# Patient Record
Sex: Male | Born: 1945 | Race: White | Hispanic: No | Marital: Married | Smoking: Former smoker
Health system: Southern US, Community
[De-identification: ages and names within clinical notes are randomized; demographics above are authoritative.]

## PROBLEM LIST (undated history)

## (undated) DIAGNOSIS — J302 Other seasonal allergic rhinitis: Secondary | ICD-10-CM

## (undated) DIAGNOSIS — N400 Enlarged prostate without lower urinary tract symptoms: Secondary | ICD-10-CM

## (undated) DIAGNOSIS — G4733 Obstructive sleep apnea (adult) (pediatric): Secondary | ICD-10-CM

## (undated) DIAGNOSIS — Z9989 Dependence on other enabling machines and devices: Secondary | ICD-10-CM

## (undated) HISTORY — PX: ANOMALOUS PULMONARY VENOUS RETURN REPAIR, TOTAL: SHX1156

## (undated) HISTORY — DX: Obstructive sleep apnea (adult) (pediatric): Z99.89

## (undated) HISTORY — PX: APPENDECTOMY: SHX54

## (undated) HISTORY — DX: Obstructive sleep apnea (adult) (pediatric): G47.33

## (undated) HISTORY — PX: HERNIA REPAIR: SHX51

## (undated) HISTORY — PX: ROTATOR CUFF REPAIR: SHX139

---

## 2007-10-17 ENCOUNTER — Ambulatory Visit: Payer: Self-pay | Admitting: Surgery

## 2007-10-24 ENCOUNTER — Ambulatory Visit: Payer: Self-pay | Admitting: Surgery

## 2009-05-13 ENCOUNTER — Ambulatory Visit: Payer: Self-pay

## 2009-06-04 ENCOUNTER — Ambulatory Visit: Payer: Self-pay | Admitting: Orthopedic Surgery

## 2009-06-11 ENCOUNTER — Ambulatory Visit: Payer: Self-pay | Admitting: Orthopedic Surgery

## 2009-06-20 ENCOUNTER — Emergency Department: Payer: Self-pay | Admitting: Internal Medicine

## 2012-08-30 ENCOUNTER — Ambulatory Visit: Payer: Self-pay | Admitting: Gastroenterology

## 2013-07-15 DIAGNOSIS — N138 Other obstructive and reflux uropathy: Secondary | ICD-10-CM | POA: Insufficient documentation

## 2013-07-15 DIAGNOSIS — N401 Enlarged prostate with lower urinary tract symptoms: Secondary | ICD-10-CM | POA: Insufficient documentation

## 2014-07-25 DIAGNOSIS — K219 Gastro-esophageal reflux disease without esophagitis: Secondary | ICD-10-CM | POA: Insufficient documentation

## 2014-07-25 DIAGNOSIS — IMO0001 Reserved for inherently not codable concepts without codable children: Secondary | ICD-10-CM | POA: Insufficient documentation

## 2014-07-25 DIAGNOSIS — E7800 Pure hypercholesterolemia, unspecified: Secondary | ICD-10-CM | POA: Insufficient documentation

## 2015-08-28 ENCOUNTER — Ambulatory Visit: Payer: Medicare Other | Admitting: Occupational Therapy

## 2015-09-01 ENCOUNTER — Ambulatory Visit: Payer: Medicare Other | Attending: Rheumatology | Admitting: Occupational Therapy

## 2015-09-01 DIAGNOSIS — M25641 Stiffness of right hand, not elsewhere classified: Secondary | ICD-10-CM | POA: Diagnosis present

## 2015-09-01 DIAGNOSIS — M79641 Pain in right hand: Secondary | ICD-10-CM | POA: Insufficient documentation

## 2015-09-01 NOTE — Therapy (Signed)
[facility] PHYSICAL AND SPORTS MEDICINE [address] [place], [place], [address] Phone: [phone]   Fax:  [phone]  Occupational Therapy Treatment  Patient Details  Name: Ethan Patton MRN: [id number] Date of Birth: Nov 09, 1945 Referring Provider: Gavin PottersKernodle  Encounter Date: 09/01/2015      OT End of Session - 09/01/15 1449    Visit Number 1   Number of Visits 4   Date for OT Re-Evaluation 09/29/15   OT Start Time 0835   OT Stop Time 0925   OT Time Calculation (min) 50 min   Activity Tolerance Patient tolerated treatment well   Behavior During Therapy [facility] for tasks assessed/performed      No past medical history on file.  No past surgical history on file.  There were no vitals filed for this visit.  Visit Diagnosis:  Pain of right hand - Plan: Ot plan of care cert/re-cert  Stiffness of hand joint, right - Plan: Ot plan of care cert/re-cert      Subjective Assessment - 09/01/15 0949    Subjective  [place] - and my hand - those 2 fingers started hurting aboutJan    Patient Stated Goals Want to be able to move those fingers better , less soreness and strength    Currently in Pain? Yes   Pain Score 2    Pain Location Finger (Comment which one)   Pain Orientation Right   Pain Descriptors / Indicators Sore   Pain Type Chronic pain   Pain Onset More than a month ago            [facility] Assessment - 09/01/15 0001    Assessment   Diagnosis R 2nd and 3rd MC digits pain    Referring Provider [employer]   Onset Date 11/07/14   Assessment Pt refer to hand therapy /OT for  R hand pain -by Dr Gavin PottersKernodle    Balance Screen   Has the patient fallen in the past 6 months No   Has the patient had a decrease in activity level because of a fear of falling?  No   Is the patient reluctant to leave their home because of a fear of falling?  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    Strength   Right Hand Grip (lbs) 82   Right Hand Lateral Pinch 21 lbs   Right Hand 3 Point Pinch 17 lbs   Left Hand Grip (lbs) 75   Left Hand Lateral Pinch 23 lbs   Left Hand 3 Point Pinch 16 lbs   Right Hand AROM   R Index  MCP 0-90 70 Degrees   R Long  MCP 0-90 75 Degrees   R Ring  MCP 0-90 85 Degrees   R Little  MCP 0-90 90 Degrees   Left Hand AROM   L Index  MCP 0-90 90 Degrees   L Long  MCP 0-90 90 Degrees   L Ring  MCP 0-90 90 Degrees   L Little  MCP 0-90 90 Degrees                  OT Treatments/Exercises (OP) - 09/01/15 0001    RUE Paraffin   Number Minutes Paraffin 10 Minutes   RUE  Paraffin Location Hand   Comments prior to review of HEP - ROM at [facility] increase 10 degrees at 2nd                 OT Education - 09/01/15 1448    Education provided Yes   Education Details HEP, joint protecition and AE   Person(s) Educated Patient   Methods Tactile cues;Demonstration;Explanation;Verbal cues;Handout   Comprehension Verbalized understanding;Returned demonstration;Verbal cues required          OT Short Term Goals - 09/01/15 1456    OT SHORT TERM GOAL #1   Title Pt AROM in all digits improved by 5-10 degrees to report more ease in using with twisting of knobs/bolts    Baseline  MC flexion on R 70-75 at 2nd and 3rd    Time 3   Period Weeks   Status New   OT SHORT TERM GOAL #2   Title Pt to be ind in HEP to increaes ROM and functional use    Baseline no knowledge of HEP    Time 2   Period Weeks   Status New           OT Long Term Goals - 09/01/15 1457    OT LONG TERM GOAL #1   Title Pt verbalize 3 joint protection and adaptations with tasks to increase ease and prevent future flare up    Baseline  no knowlege on joint protection    Time 4   Period Weeks   Status New               Plan - 09/01/15  1450    Clinical Impression Statement Pt present with increase pain over R dominant hand 2nd and 3rd MC - decrease ROM - pt grip and prehension WFL -but he do RD if loading 2nd and 3rd during 3 point grip - pt can benefit from OT services to decrease pain , increase ROM and  joint protection and AE education    Pt will benefit from skilled therapeutic intervention in order to improve on the following deficits (Retired) Decreased range of motion;Impaired flexibility;Pain;Decreased strength;Impaired UE functional use   Rehab Potential Good   OT Frequency 1x / week   OT Duration 4 weeks   OT Treatment/Interventions Self-care/ADL training;Patient/family education;Therapeutic exercise;Manual Therapy;Parrafin;DME and/or AE instruction   Plan assess ROM , pain - ed on joint protection and AE   OT Home Exercise Plan see pt instruction   Consulted and Agree with Plan of Care Patient        Problem List There are no active problems to display for this patient.   Oletta Cohn OTR/L,CLT 09/01/2015, 3:03 PM  [facility] [facility] [facility] PHYSICAL AND SPORTS MEDICINE [address] [place], [address] Phone: [phone]   Fax:  [phone]  Name: Ethan Patton MRN: [id number] Date of Birth: 07-18-1946

## 2015-09-01 NOTE — Patient Instructions (Signed)
Pt to do moist heat  Tapping of digits Tendon glides  Opposition  RD of digits  10 reps each  1 -2 x day   Ed on joint protection principles and AE trng - hand out provided

## 2015-09-08 ENCOUNTER — Ambulatory Visit: Payer: Medicare Other | Attending: Hematology and Oncology | Admitting: Occupational Therapy

## 2015-09-08 DIAGNOSIS — M25641 Stiffness of right hand, not elsewhere classified: Secondary | ICD-10-CM | POA: Insufficient documentation

## 2015-09-08 DIAGNOSIS — M79641 Pain in right hand: Secondary | ICD-10-CM | POA: Diagnosis present

## 2015-09-08 NOTE — Patient Instructions (Signed)
COnt with same HEP as last time - add tapping - extention of digits   Reinforce joint protection principles - larger joints, avoid tight sustained grip  AE to get

## 2015-09-08 NOTE — Therapy (Signed)
[facility] PHYSICAL AND SPORTS MEDICINE [address], [place], [address] Phone: [phone]   Fax:  [phone]  Occupational Therapy Treatment and discharge  Patient Details  Name: Ethan Patton MRN: [id number] Date of Birth: 1946/04/30 Referring Provider: Jefm Bryant  Encounter Date: 09/08/2015      OT End of Session - 09/08/15 1107    Visit Number 2   Number of Visits 2   Date for OT Re-Evaluation 09/08/15   OT Start Time 0953   OT Stop Time 1023   OT Time Calculation (min) 30 min   Activity Tolerance Patient tolerated treatment well   Behavior During Therapy [facility] for tasks assessed/performed      No past medical history on file.  No past surgical history on file.  There were no vitals filed for this visit.  Visit Diagnosis:  Pain of right hand  Stiffness of hand joint, right      Subjective Assessment - 09/08/15 0956    Subjective  Pain not that bad - more stiffness - and did the heatingpad - the side ways exercises hurt    Patient Stated Goals Want to be able to move those fingers better , less soreness and strength    Currently in Pain? Yes   Pain Score 2    Pain Location Hand   Pain Orientation Right   Pain Descriptors / Indicators Sore            [facility] OT Assessment - 09/08/15 0001    Strength   Right Hand Grip (lbs) 82   Right Hand Lateral Pinch 21 lbs   Right Hand 3 Point Pinch 17 lbs   Left Hand Grip (lbs) 75   Left Hand Lateral Pinch 23 lbs   Left Hand 3 Point Pinch 16 lbs   Right Hand AROM   R Index  MCP 0-90 80 Degrees   R Long  MCP 0-90 90 Degrees   R Ring  MCP 0-90 90 Degrees   R Little  MCP 0-90 90 Degrees   Left Hand AROM   L Index  MCP 0-90 90 Degrees   L Long  MCP 0-90 90 Degrees   L Ring  MCP 0-90 90 Degrees   L Little  MCP 0-90 90 Degrees                  OT Treatments/Exercises (OP) - 09/08/15 0001    ADLs   ADL Comments Discuss with pt joint protection - large joint   and built up handles - avoid tight prolonged grip - also jar openers , pt not holding book  - get clip , and pick up laptop with palm - not lat grip , did change some like pick up groceries with palm underneath    Hand Exercises   Other Hand Exercises Tendon glides  AROM , opposition , digits extention tapping  , and RD of digits - had no pain except if flexion of 2nd MC more than 85 - needed min v/c to pull down into palm - not fist only to distal palm    Other Hand Exercises measurement for ROM taken   RUE Paraffin   Number Minutes Paraffin 10 Minutes   RUE Paraffin Location Hand   Comments Prior to ROM , to increase ROM , decrease pain -  ROM increase with 10-15 degrees    Manual Therapy   Manual therapy comments Soft tissue mobs to 2nd and 3rd MC -  joint mobs and gentle traction - no pain  prior to ROM                 OT Education - 09/08/15 1107    Education provided Yes   Education Details HEP , jointprotection    Person(s) Educated Patient   Methods Explanation;Demonstration;Verbal cues;Tactile cues   Comprehension Verbal cues required;Returned demonstration;Verbalized understanding          OT Short Term Goals - 09/08/15 1110    OT SHORT TERM GOAL #1   Title Pt AROM in all digits improved by 5-10 degrees to report more ease in using with twisting of knobs/bolts    Baseline MC 80  2nd , 3rd MC 90 degrees   Status Achieved   OT SHORT TERM GOAL #2   Title Pt to be ind in HEP to increaes ROM and functional use    Baseline has knowledge   Status Achieved           OT Long Term Goals - 09/08/15 1110    OT LONG TERM GOAL #1   Title Pt verbalize 3 joint protection and adaptations with tasks to increase ease and prevent future flare up    Baseline implementing some of it - still needed some education and reinforcement    Status Achieved               Plan - 09/08/15 1108    Clinical Impression Statement Pt show understanding for HEP to increase and  maintain ROM in digits - as well as joint protection principles/AE to implement to prevent  pain and maintain his ROM / strength - pt met all goals and discharge from OT services at this time with HEP    Rehab Potential Good   OT Treatment/Interventions Self-care/ADL training;Patient/family education;Therapeutic exercise;Manual Therapy;Parrafin;DME and/or AE instruction   Plan Discharge with home program    OT Home Exercise Plan see pt instruction   Consulted and Agree with Plan of Care Patient        Problem List There are no active problems to display for this patient.   Rosalyn Gess OTR/L.,CLT  09/08/2015, 11:12 AM  [facility] PHYSICAL AND SPORTS MEDICINE [address], [place], [address] Phone: [phone]   Fax:  [phone]  Name: Ethan Patton MRN: [id number] Date of Birth: [birth year]

## 2017-08-09 ENCOUNTER — Ambulatory Visit
Admission: EM | Admit: 2017-08-09 | Discharge: 2017-08-09 | Disposition: A | Discharge status: Home / Self Care | Payer: Medicare Other | Attending: Family Medicine | Admitting: Family Medicine

## 2017-08-09 DIAGNOSIS — H60501 Unspecified acute noninfective otitis externa, right ear: Secondary | ICD-10-CM

## 2017-08-09 DIAGNOSIS — H6691 Otitis media, unspecified, right ear: Secondary | ICD-10-CM | POA: Diagnosis not present

## 2017-08-09 MED ORDER — OFLOXACIN 0.3 % OT SOLN: 10.0000 [drp] | Freq: Every day | OTIC | 0 refills | Status: AC

## 2017-08-09 MED ORDER — CEFDINIR 300 MG PO CAPS: 300.0000 mg | ORAL_CAPSULE | Freq: Two times a day (BID) | ORAL | 0 refills | Status: DC

## 2017-08-09 NOTE — ED Triage Notes (Signed)
Patient complains of right ear pressure. Patient states that he has been noticing it for 4 days. Patient states that he has placed hydrogen peroxide in the ear. Patient states that he felt like he had liquid in the ear and now has decreased hearing in this ear.

## 2017-08-09 NOTE — Discharge Instructions (Signed)
Take medication as prescribed. Rest. Drink plenty of fluids.  ° °Follow up with your primary care physician this week as needed. Return to Urgent care for new or worsening concerns.  ° °

## 2017-08-09 NOTE — ED Provider Notes (Signed)
[facility] ____________________________________________  Time seen: Approximately 0950 AM  I have reviewed the triage vital signs and the nursing notes.   HISTORY  Chief Complaint Ear Fullness (right)   HPI Ethan Patton is a 71 y.o. male presenting for evaluation of right ear pain, fullness and muffled hearing. Patient reports been present for the last 3-4 days. Patient reports he has chronic sinusitis and allergy issues and has chronic nasal congestion, states current nasal congestion is consistent with chronic. Denies sinus pain or symptoms of sinus infections. Patient reports right ear pain has been gradual in onset and has been persistent states mild pain at this time. States pain mild at this time. Patient reports he has been using multiple drops over-the-counter as well as peroxide and vinegar in his ear without any improvement. States he feels like he has some fluid present in his ear. Denies left ear pain or discomfort. Denies fevers, sore throat, cough or congestion. Reports otherwise feels well. Denies recent sickness.   Mickey Farber, MD: PCP   History reviewed. No pertinent past medical history. Seasonal allergies  Enlarged Prostate   There are no active problems to display for this patient.   Past Surgical History:  Procedure Laterality Date  . APPENDECTOMY    . HERNIA REPAIR    . ROTATOR CUFF REPAIR Left      No current facility-administered medications for this encounter.   Current Outpatient Prescriptions:  .  finasteride (PROSCAR) 5 MG tablet, Take 5 mg by mouth daily., Disp: , Rfl:  .  fluticasone (FLONASE) 50 MCG/ACT nasal spray, Place into both nostrils daily., Disp: , Rfl:  .  loratadine (CLARITIN) 10 MG tablet, Take 10 mg by mouth daily., Disp: , Rfl:  .  montelukast (SINGULAIR) 10 MG tablet, Take 10 mg by mouth at bedtime., Disp: , Rfl:  .  cefdinir (OMNICEF) 300 MG capsule, Take 1 capsule (300 mg total) by mouth 2 (two) times daily.,  Disp: 20 capsule, Rfl: 0 .  ofloxacin (FLOXIN) 0.3 % OTIC solution, Place 10 drops into the right ear daily., Disp: 5 mL, Rfl: 0  Allergies Patient has no known allergies.  History reviewed. No pertinent family history.  Social History Social History  Substance Use Topics  . Smoking status: Former Games developer  . Smokeless tobacco: Never Used  . Alcohol use Yes     Comment: occasional wine    Review of Systems Constitutional: No fever/chills ENT: No sore throat. As above.  Cardiovascular: Denies chest pain. Respiratory: Denies shortness of breath. Gastrointestinal: No abdominal pain. Musculoskeletal: Negative for back pain. Skin: Negative for rash.   ____________________________________________   PHYSICAL EXAM:  VITAL SIGNS: ED Triage Vitals  Enc Vitals Group     BP 08/09/17 0939 128/79     Pulse Rate 08/09/17 0939 64     Resp 08/09/17 0939 17     Temp 08/09/17 0939 98.1 F (36.7 C)     Temp Source 08/09/17 0939 Oral     SpO2 08/09/17 0939 100 %     Weight 08/09/17 0935 169 lb (76.7 kg)     Height 08/09/17 0935  (1.778 m)     Head Circumference --      Peak Flow --      Pain Score 08/09/17 0935 0     Pain Loc --      Pain Edu? --      Excl. in GC? --     Constitutional: Alert and oriented. Well appearing and  in no acute distress. Eyes: Conjunctivae are normal.  Head: Atraumatic. No sinus tenderness to palpation. No swelling. No erythema.  Ears: Left: nontender, no erythema, normal TM. Right: nontener, moderate erythema and mild edematous canal without drainage, moderate erythema and dullness to TM, TM appears intact. No surrounding tenderness, swelling or erythema present bilaterally.   Nose:Nasal congestion   Mouth/Throat: Mucous membranes are moist. No pharyngeal erythema. No tonsillar swelling or exudate.  Neck: No stridor.  No cervical spine tenderness to palpation. Hematological/Lymphatic/Immunilogical: No cervical lymphadenopathy. Cardiovascular:  Normal rate, regular rhythm. Grossly normal heart sounds.  Good peripheral circulation. Respiratory: Normal respiratory effort.  No retractions. No wheezes, rales or rhonchi. Good air movement.  Musculoskeletal: Ambulatory with steady gait. No cervical, thoracic or lumbar tenderness to palpation. Neurologic:  Normal speech and language. No gait instability. Skin:  Skin appears warm, dry and intact. No rash noted. Psychiatric: Mood and affect are normal. Speech and behavior are normal.  ___________________________________________   LABS (all labs ordered are listed, but only abnormal results are displayed)  Labs Reviewed - No data to display ____________________________________________   PROCEDURES Procedures   INITIAL IMPRESSION / ASSESSMENT AND PLAN / ED COURSE  Pertinent labs & imaging results that were available during my care of the patient were reviewed by me and considered in my medical decision making (see chart for details).  Well-appearing patient. Right otitis media and right otitis externa. Will treat patient with oral cefdinir and ofloxacin. Patient reports recently treated with oral amoxicillin for dental implant approximate 1-2 months ago. Doubt sinusitis at this time. Encourage keeping ear dry, supportive care and follow-up. Discussed indication, risks and benefits of medications with patient.  Discussed follow up with Primary care physician this week. Discussed follow up and return parameters including no resolution or any worsening concerns. Patient verbalized understanding and agreed to plan.   ____________________________________________   FINAL CLINICAL IMPRESSION(S) / ED DIAGNOSES  Final diagnoses:  Right otitis media, unspecified otitis media type  Acute otitis externa of right ear, unspecified type     Discharge Medication List as of 08/09/2017  9:47 AM    START taking these medications   Details  cefdinir (OMNICEF) 300 MG capsule Take 1 capsule (300  mg total) by mouth 2 (two) times daily., Starting Wed 08/09/2017, Normal    ofloxacin (FLOXIN) 0.3 % OTIC solution Place 10 drops into the right ear daily., Starting Wed 08/09/2017, Until Wed 08/16/2017, Normal        Note: This dictation was prepared with Dragon dictation along with smaller phrase technology. Any transcriptional errors that result from this process are unintentional.         Renford Dills, NP 08/09/17 1116

## 2018-07-13 ENCOUNTER — Other Ambulatory Visit: Payer: Self-pay

## 2018-07-13 ENCOUNTER — Encounter: Payer: Self-pay | Admitting: Emergency Medicine

## 2018-07-13 ENCOUNTER — Ambulatory Visit
Admission: EM | Admit: 2018-07-13 | Discharge: 2018-07-13 | Disposition: A | Discharge status: Home / Self Care | Payer: Medicare Other | Attending: Emergency Medicine | Admitting: Emergency Medicine

## 2018-07-13 DIAGNOSIS — J0141 Acute recurrent pansinusitis: Secondary | ICD-10-CM

## 2018-07-13 HISTORY — DX: Other seasonal allergic rhinitis: J30.2

## 2018-07-13 HISTORY — DX: Benign prostatic hyperplasia without lower urinary tract symptoms: N40.0

## 2018-07-13 MED ORDER — AMOXICILLIN-POT CLAVULANATE 875-125 MG PO TABS: 1.0000 | ORAL_TABLET | Freq: Two times a day (BID) | ORAL | 0 refills | Status: AC

## 2018-07-13 MED ORDER — AMOXICILLIN-POT CLAVULANATE 875-125 MG PO TABS: 1.0000 | ORAL_TABLET | Freq: Two times a day (BID) | ORAL | 0 refills | Status: DC

## 2018-07-13 NOTE — ED Triage Notes (Signed)
Patient in today c/o nasal congestion and sinus pain x 3 weeks. Patient uses a nasal spray, Singulair and Loratadine without relief.

## 2018-07-13 NOTE — Discharge Instructions (Addendum)
augmentin for 10 days, discontinue the Claritin, start regular Mucinex instead.  Ibuprofen 400 mg to take with 1 g of Tylenol together 3 or 4 times a day.  Continue saline nasal irrigation, Flonase, Singulair

## 2018-07-13 NOTE — ED Provider Notes (Signed)
HPI  SUBJECTIVE:  Ethan Patton is a 72 y.o. male who presents with 3 weeks of upper dental pain, sinus pain and pressure, malaise.  States that he got somewhat better with using a Neti pot, Flonase, Singulair, Claritin, but then got worse week ago.  States that he never got completely better.  He reports burning pain in his nose consistent with previous sinus infections.  No fevers or body aches.  No antibiotics in the past month.  He took one Aleve in 4 to 6 hours of evaluation.  Symptoms are worse with eating, brushing his teeth.  He has a past medical history of frequent sinusitis for which he has seen ENT.  No history of diabetes, hypertension.  JG:GEZMO, Ethan Hua, MD .   Past Medical History:  Diagnosis Date  . Enlarged prostate   . Seasonal allergies     Past Surgical History:  Procedure Laterality Date  . APPENDECTOMY    . HERNIA REPAIR    . ROTATOR CUFF REPAIR Left     Family History  Problem Relation Age of Onset  . Stroke Mother   . Congestive Heart Failure Father   . Liver cancer Brother     Social History   Tobacco Use  . Smoking status: Former Smoker    Last attempt to quit: 1987    Years since quitting: 32.7  . Smokeless tobacco: Never Used  Substance Use Topics  . Alcohol use: Yes    Comment: occasional wine  . Drug use: No    No current facility-administered medications for this encounter.   Current Outpatient Medications:  .  finasteride (PROSCAR) 5 MG tablet, Take 5 mg by mouth daily., Disp: , Rfl:  .  fluticasone (FLONASE) 50 MCG/ACT nasal spray, Place into both nostrils daily., Disp: , Rfl:  .  montelukast (SINGULAIR) 10 MG tablet, Take 10 mg by mouth at bedtime., Disp: , Rfl:  .  amoxicillin-clavulanate (AUGMENTIN) 875-125 MG tablet, Take 1 tablet by mouth 2 (two) times daily for 10 days., Disp: 20 tablet, Rfl: 0  No Known Allergies   ROS  As noted in HPI.   Physical Exam  BP 122/72 (BP Location: Left Arm)   Pulse 66   Temp 98 F (36.7  C) (Oral)   Resp 16   Ht 5\' 9"  (1.753 m)   Wt 83.9 kg   SpO2 100%   BMI 27.32 kg/m   Constitutional: Well developed, well nourished, no acute distress Eyes:  EOMI, conjunctiva normal bilaterally HENT: Normocephalic, atraumatic,mucus membranes moist. + purulent nasal congestion. Normal turbinates. - maxillary sinus tenderness, - frontal sinus tenderness. Oropharynx normal. - postnasal drip. Normal dentition.  Respiratory: Normal inspiratory effort Cardiovascular: Normal rate GI: nondistended skin: No rash, skin intact Musculoskeletal: no deformities Neurologic: Alert & oriented x 3, no focal neuro deficits Psychiatric: Speech and behavior appropriate   ED Course   Medications - No data to display  No orders of the defined types were placed in this encounter.   No results found for this or any previous visit (from the past 24 hour(s)). No results found.  ED Clinical Impression  Acute recurrent pansinusitis  ED Assessment/Plan  Pt with indications for abx. Will start augmentin for 10 days, discontinue the Claritin, start regular Mucinex instead.  Ibuprofen 400 mg to take with 1 g of Tylenol together 3 or 4 times a day.  Continue saline nasal irrigation, Flonase, Singulair . follow Up with PMD as needed, to the ER if he gets worse.  Discussed MDM and plan with pt. Discussed sn/sx that should prompt return to the  ED. Pt agrees with plan.  *This clinic note was created using Dragon dictation software. Therefore, there may be occasional mistakes despite careful proofreading.  ?     Domenick Gong, MD 07/14/18 1350

## 2018-10-24 ENCOUNTER — Ambulatory Visit
Admission: RE | Admit: 2018-10-24 | Discharge: 2018-10-24 | Disposition: A | Discharge status: Home / Self Care | Payer: Medicare Other | Attending: Cardiology | Admitting: Cardiology

## 2018-10-24 ENCOUNTER — Other Ambulatory Visit: Payer: Self-pay

## 2018-10-24 ENCOUNTER — Encounter
Admission: RE | Disposition: A | Discharge status: Home / Self Care | Payer: Self-pay | Source: Home / Self Care | Attending: Cardiology

## 2018-10-24 ENCOUNTER — Other Ambulatory Visit: Payer: Self-pay | Admitting: Cardiology

## 2018-10-24 ENCOUNTER — Encounter: Payer: Self-pay | Admitting: *Deleted

## 2018-10-24 DIAGNOSIS — G4733 Obstructive sleep apnea (adult) (pediatric): Secondary | ICD-10-CM | POA: Insufficient documentation

## 2018-10-24 DIAGNOSIS — N400 Enlarged prostate without lower urinary tract symptoms: Secondary | ICD-10-CM | POA: Insufficient documentation

## 2018-10-24 DIAGNOSIS — Z8261 Family history of arthritis: Secondary | ICD-10-CM | POA: Diagnosis not present

## 2018-10-24 DIAGNOSIS — Z87891 Personal history of nicotine dependence: Secondary | ICD-10-CM | POA: Insufficient documentation

## 2018-10-24 DIAGNOSIS — Z951 Presence of aortocoronary bypass graft: Secondary | ICD-10-CM | POA: Diagnosis not present

## 2018-10-24 DIAGNOSIS — Z7982 Long term (current) use of aspirin: Secondary | ICD-10-CM | POA: Insufficient documentation

## 2018-10-24 DIAGNOSIS — I060 Rheumatic aortic stenosis: Secondary | ICD-10-CM | POA: Insufficient documentation

## 2018-10-24 DIAGNOSIS — M1990 Unspecified osteoarthritis, unspecified site: Secondary | ICD-10-CM | POA: Insufficient documentation

## 2018-10-24 DIAGNOSIS — R9439 Abnormal result of other cardiovascular function study: Secondary | ICD-10-CM | POA: Diagnosis not present

## 2018-10-24 DIAGNOSIS — Z8249 Family history of ischemic heart disease and other diseases of the circulatory system: Secondary | ICD-10-CM | POA: Insufficient documentation

## 2018-10-24 DIAGNOSIS — I2510 Atherosclerotic heart disease of native coronary artery without angina pectoris: Secondary | ICD-10-CM | POA: Diagnosis not present

## 2018-10-24 DIAGNOSIS — K219 Gastro-esophageal reflux disease without esophagitis: Secondary | ICD-10-CM | POA: Diagnosis not present

## 2018-10-24 DIAGNOSIS — Z79899 Other long term (current) drug therapy: Secondary | ICD-10-CM | POA: Insufficient documentation

## 2018-10-24 DIAGNOSIS — E7800 Pure hypercholesterolemia, unspecified: Secondary | ICD-10-CM | POA: Diagnosis not present

## 2018-10-24 DIAGNOSIS — I25111 Atherosclerotic heart disease of native coronary artery with angina pectoris with documented spasm: Secondary | ICD-10-CM

## 2018-10-24 DIAGNOSIS — I350 Nonrheumatic aortic (valve) stenosis: Secondary | ICD-10-CM

## 2018-10-24 HISTORY — PX: RIGHT/LEFT HEART CATH AND CORONARY ANGIOGRAPHY: CATH118266

## 2018-10-24 SURGERY — RIGHT/LEFT HEART CATH AND CORONARY ANGIOGRAPHY
Anesthesia: Moderate Sedation

## 2018-10-24 MED ORDER — SODIUM CHLORIDE 0.9 % IV SOLN: 250.0000 mL | INTRAVENOUS | Status: DC | PRN

## 2018-10-24 MED ORDER — ONDANSETRON HCL 4 MG/2ML IJ SOLN: 4.0000 mg | Freq: Four times a day (QID) | INTRAMUSCULAR | Status: DC | PRN

## 2018-10-24 MED ORDER — ACETAMINOPHEN 325 MG PO TABS: 650.0000 mg | ORAL_TABLET | ORAL | Status: DC | PRN

## 2018-10-24 MED ORDER — SODIUM CHLORIDE 0.9% FLUSH: 3.0000 mL | INTRAVENOUS | Status: DC | PRN

## 2018-10-24 MED ORDER — MIDAZOLAM HCL 2 MG/2ML IJ SOLN
INTRAMUSCULAR | Status: DC | PRN
  Administered 2018-10-24 (×2): 1 mg via INTRAVENOUS

## 2018-10-24 MED ORDER — FENTANYL CITRATE (PF) 100 MCG/2ML IJ SOLN
INTRAMUSCULAR | Status: DC | PRN
  Administered 2018-10-24 (×2): 25 ug via INTRAVENOUS

## 2018-10-24 MED ORDER — SODIUM CHLORIDE 0.9% FLUSH: 3.0000 mL | Freq: Two times a day (BID) | INTRAVENOUS | Status: DC

## 2018-10-24 MED ORDER — ASPIRIN 81 MG PO CHEW
CHEWABLE_TABLET | ORAL | Status: AC
  Filled 2018-10-24: qty 1

## 2018-10-24 MED ORDER — SODIUM CHLORIDE 0.9 % WEIGHT BASED INFUSION: 1.0000 mL/kg/h | INTRAVENOUS | Status: DC

## 2018-10-24 MED ORDER — MIDAZOLAM HCL 2 MG/2ML IJ SOLN
INTRAMUSCULAR | Status: AC
  Filled 2018-10-24: qty 2

## 2018-10-24 MED ORDER — IOPAMIDOL (ISOVUE-300) INJECTION 61%
INTRAVENOUS | Status: DC | PRN
  Administered 2018-10-24: 80 mL via INTRA_ARTERIAL

## 2018-10-24 MED ORDER — SODIUM CHLORIDE 0.9 % WEIGHT BASED INFUSION
3.0000 mL/kg/h | INTRAVENOUS | Status: AC
  Administered 2018-10-24: 3 mL/kg/h via INTRAVENOUS

## 2018-10-24 MED ORDER — FENTANYL CITRATE (PF) 100 MCG/2ML IJ SOLN
INTRAMUSCULAR | Status: AC
  Filled 2018-10-24: qty 2

## 2018-10-24 MED ORDER — ASPIRIN 81 MG PO CHEW
81.0000 mg | CHEWABLE_TABLET | ORAL | Status: AC
  Administered 2018-10-24: 81 mg via ORAL

## 2018-10-24 MED ORDER — HEPARIN (PORCINE) IN NACL 1000-0.9 UT/500ML-% IV SOLN
INTRAVENOUS | Status: AC
  Filled 2018-10-24: qty 1000

## 2018-10-24 SURGICAL SUPPLY — 16 items
CATH INFINITI 5FR ANG PIGTAIL (CATHETERS) ×3 IMPLANT
CATH INFINITI 5FR JL4 (CATHETERS) ×3 IMPLANT
CATH INFINITI JR4 5F (CATHETERS) ×3 IMPLANT
CATH SWANZ 7F THERMO (CATHETERS) ×3 IMPLANT
GUIDEWIRE EMER 3M J .025X150CM (WIRE) ×3 IMPLANT
KIT MANI 3VAL PERCEP (MISCELLANEOUS) ×3 IMPLANT
KIT RIGHT HEART (MISCELLANEOUS) ×3 IMPLANT
NEEDLE ENTRY 21GA 7CM ECHOTIP (NEEDLE) ×3 IMPLANT
NEEDLE PERC 18GX7CM (NEEDLE) ×6 IMPLANT
PACK CARDIAC CATH (CUSTOM PROCEDURE TRAY) ×3 IMPLANT
SET INTRO CAPELLA COAXIAL (SET/KITS/TRAYS/PACK) ×3 IMPLANT
SHEATH AVANTI 6FR X 11CM (SHEATH) ×3 IMPLANT
SHEATH AVANTI 7FRX11 (SHEATH) ×6 IMPLANT
WIRE EMERALD ST .035X150CM (WIRE) ×3 IMPLANT
WIRE GUIDERIGHT .035X150 (WIRE) ×3 IMPLANT
WIRE NITINOL .018 (WIRE) ×3 IMPLANT

## 2018-10-24 NOTE — H&P (Signed)
Chief Complaint: Chief Complaint  Patient presents with  . Follow-up  stress test  Date of Service: 10/12/2018 Date of Birth: 08-15-1946 PCP: Rea Collegehies, David Newcomb, MD  History of Present Illness: [name] is a 72 y.o.male patient who with a past medical history significant for obstructive sleep apnea, no longer on CPAP since weight loss, and aortic sclerosis who presents to review stress echocardiogram test results. Admits to shortness of breath and vague chest pressure-like symptoms. [name] reports 2 instances where he was pulling heavy objects and had to sit down and catch his breath due to a "funny feeling in his chest." No associated diaphoresis. No radiating factors. Is fairly active, but is hesitant to resume physical activity until further workup is completed. Denies dizziness, lightheadedness, or recent falls/syncopal episodes. Has sporadic lower extremity swelling. Denies palpitations.   Stress echo on 10/09/18 revealed severe aortic stenosis with a valve area calculated at .60cm2. Stress test revealed reversible mid-apical anteroseptal and inferoseptal wall motion abnormality. Resting EF greater than 55%, stress EF estimated at 50%.   Past Medical and Surgical History  Past Medical History Past Medical History:  Diagnosis Date  . Aortic sclerosis  . Arthritis  . BPH (benign prostatic hypertrophy)  NEG BX  . GERD (gastroesophageal reflux disease)  . Hypercholesterolemia  . OSA (obstructive sleep apnea)   Past Surgical History He has a past surgical history that includes Laparoscopic appendectomy (04/2003); Umbilical hernia repair (10/2007); Arthroscopic Rotator Cuff Repair (Left, 06/2009); excision basal cell cancer (02/2000); Appendectomy; Fracture surgery; Hernia repair; Colonoscopy (09/11/2017); and egd (09/11/2017).   Medications and Allergies  Current Medications  Current Outpatient Medications  Medication Sig Dispense Refill  . aspirin 81 MG EC tablet Take 81 mg by  mouth once daily.  . B INFANTIS/B ANI/B LON/B BIFID (PROBIOTIC DIGESTIVE CARE ORAL) Take by mouth once daily.  . calcium carbonate (TUMS E-X) 300 mg (750 mg) chewable tablet Take 300 mg of elemental by mouth every 2 (two) hours as needed for Heartburn.  . cholecalciferol (VITAMIN D3) 1,000 unit capsule Take 1,000 Units by mouth once daily.  Marland Kitchen. docusate (COLACE) 100 MG capsule Take 100 mg by mouth once daily as needed for Constipation.  . finasteride (PROSCAR) 5 mg tablet Take 5 mg by mouth once daily.   . fluticasone propionate (FLONASE) 50 mcg/actuation nasal spray Place 1-2 sprays into both nostrils once daily as needed for Allergies 48 g 3  . loratadine (CLARITIN) 10 mg tablet Take 1 tablet (10 mg total) by mouth once daily 90 tablet 3  . montelukast (SINGULAIR) 10 mg tablet Take 1 tablet (10 mg total) by mouth nightly For allergies 90 tablet 3  . multivitamin with iron tablet Take 1 tablet by mouth once daily  . NYSTOP 100,000 unit/gram powder APP TOPICALLY TO FEET BID 8  . sildenafil (REVATIO) 20 mg tablet 1 tablets po daily as needed   No current facility-administered medications for this visit.   Allergies: Patient has no known allergies.  Social and Family History  Social History reports that he quit smoking about 32 years ago. His smoking use included pipe. He has never used smokeless tobacco. He reports current alcohol use. He reports that he does not use drugs.  Family History Family History  Problem Relation Age of Onset  . Heart failure Father  . Coronary Artery Disease (Blocked arteries around heart) Brother  S/P CABG  . Arthritis Maternal Grandmother   Review of Systems   Review of Systems: The patient denies  chest pain, shortness of breath, orthopnea, paroxysmal nocturnal dyspnea, pedal edema, palpitations, heart racing, fatigue, dizziness, lightheadedness, presyncope, syncope, leg pain, leg cramping. Review of 12 Systems is negative except as described in HPI.    Physical Examination   Vitals:BP 120/84  Pulse 84  Resp 16  Ht 177.8 cm (5\' 10" )  Wt 84.9 kg (187 lb 2.7 oz)  SpO2 98%  BMI 26.86 kg/m  Ht:177.8 cm (5\' 10" ) Wt:84.9 kg (187 lb 2.7 oz) ZOX:WRUE surface area is 2.05 meters squared. Body mass index is 26.86 kg/m.  General: Well developed, well nourished. In no acute distress HEENT: Pupils equally reactive to light and accomodation  Neck: Supple without thyromegaly, or goiter. Carotid pulses 2+. No carotid bruits present.  Pulmonary: Clear to auscultation bilaterally; no wheezes, rales, rhonchi Cardiovascular: Regular rate and rhythm. No gallops, murmurs or rubs Gastrointestinal: Soft nontender, nondistended, with normal bowel sounds Extremities: No cyanosis, clubbing, or edema Peripheral Pulses: 2+ in upper extremities, 2+ in lower extremities  Neurology: Alert and oriented X3 Pysch: Good affect. Responds appropriately  Assessment   72 y.o. male with  1. Aortic valve sclerosis  2. OSA (obstructive sleep apnea)  3. Abnormal stress test  4. Severe aortic stenosis   Plan  1. Aortic valve stenosis  -Will proceed with left and right heart catheterization  -Referral to CT surgery likely pending results  2. Obstructive sleep apnea -Encouraged to resume CPAP 3. Abnormal stress test -Will proceed with cardiac catheterization; risks and benefits discussed in thorough detail   Dalia Heading MD  Pt seen and examined. No change from above.

## 2018-10-25 ENCOUNTER — Encounter: Payer: Self-pay | Admitting: Cardiology

## 2019-01-29 DIAGNOSIS — Z952 Presence of prosthetic heart valve: Secondary | ICD-10-CM | POA: Insufficient documentation

## 2019-02-19 ENCOUNTER — Telehealth: Payer: Self-pay | Admitting: Urology

## 2019-02-19 NOTE — Telephone Encounter (Signed)
Patient is transferring care from [facility] and wants you to refill his finasteride until he can be seen as a new patient the end of May? Are you willing to do this? We have never seen him here in our clinic.   Marcelino Duster

## 2019-02-19 NOTE — Telephone Encounter (Signed)
Since I have not established care I will not be able to refill.  Would recommend he see if his PCP can refill until he has an appointment scheduled here.

## 2019-02-22 ENCOUNTER — Ambulatory Visit: Payer: Self-pay | Admitting: Urology

## 2019-03-28 ENCOUNTER — Ambulatory Visit: Payer: Self-pay | Admitting: Urology

## 2019-04-25 ENCOUNTER — Ambulatory Visit: Payer: Self-pay | Admitting: Urology

## 2019-05-21 ENCOUNTER — Encounter: Payer: Self-pay | Admitting: Urology

## 2019-05-21 ENCOUNTER — Ambulatory Visit (INDEPENDENT_AMBULATORY_CARE_PROVIDER_SITE_OTHER): Payer: Medicare Other | Admitting: Urology

## 2019-05-21 ENCOUNTER — Other Ambulatory Visit: Payer: Self-pay

## 2019-05-21 VITALS — BP 131/73 | HR 76 | Ht 69.5 in | Wt 186.8 lb

## 2019-05-21 DIAGNOSIS — R972 Elevated prostate specific antigen [PSA]: Secondary | ICD-10-CM | POA: Diagnosis not present

## 2019-05-21 DIAGNOSIS — N5201 Erectile dysfunction due to arterial insufficiency: Secondary | ICD-10-CM

## 2019-05-21 DIAGNOSIS — R3912 Poor urinary stream: Secondary | ICD-10-CM

## 2019-05-21 DIAGNOSIS — N401 Enlarged prostate with lower urinary tract symptoms: Secondary | ICD-10-CM | POA: Diagnosis not present

## 2019-05-21 MED ORDER — FINASTERIDE 5 MG PO TABS: 5.0000 mg | ORAL_TABLET | Freq: Every day | ORAL | 3 refills | Status: DC

## 2019-05-21 NOTE — Progress Notes (Signed)
05/21/2019 10:53 AM   Ethan Patton [birth year] [id number]  Referring provider: Ezequiel Kayser, MD [facility] [place],  [place] [address]  Chief Complaint  Patient presents with  . Establish Care    HPI: Ethan Patton is a 73 year old male who presents to establish local urologic care.  He has previous been followed by Dr. Jacqlyn Larsen for >15 years for an elevated PSA and BPH with lower urinary tract symptoms.  He last saw Dr. Jacqlyn Larsen at [facility] in April 2019.  He underwent a prostate biopsy in 2002 however review of available records do not mention the PSA level at the time of his biopsy.  The biopsy was benign and he was started on finasteride.  His PSA has been stable for several years and was 2.37 (uncorrected) in April 2019.  He presently has no bothersome lower urinary tract symptoms and IPSS completed today was 12/1. Denies dysuria, gross hematuria or flank/abdominal/pelvic/scrotal pain.  He uses sildenafil as needed for ED.  PMH: Past Medical History:  Diagnosis Date  . Enlarged prostate   . Seasonal allergies     Surgical History: Past Surgical History:  Procedure Laterality Date  . APPENDECTOMY    . HERNIA REPAIR    . RIGHT/LEFT HEART CATH AND CORONARY ANGIOGRAPHY N/A 10/24/2018   Procedure: RIGHT/LEFT HEART CATH AND CORONARY ANGIOGRAPHY;  Surgeon: Teodoro Spray, MD;  Location: [facility];  Service: Cardiovascular;  Laterality: N/A;  . ROTATOR CUFF REPAIR Left     Home Medications:  Allergies as of 05/21/2019   No Known Allergies     Medication List       Accurate as of May 21, 2019 10:53 AM. If you have any questions, ask your nurse or doctor.        aspirin EC 81 MG tablet Take 81 mg by mouth daily.   cholecalciferol 25 MCG (1000 UT) tablet Commonly known as: VITAMIN D3 Take 1,000 Units by mouth daily.   clopidogrel 75 MG tablet Commonly known as: PLAVIX TK 1 T PO ONCE D   finasteride 5 MG tablet Commonly known as:  PROSCAR Take 5 mg by mouth daily.   fluticasone 50 MCG/ACT nasal spray Commonly known as: FLONASE Place 2 sprays into both nostrils daily.   Iron 240 (27 Fe) MG Tabs Take 240 mg by mouth daily.   loratadine 10 MG tablet Commonly known as: CLARITIN Take 10 mg by mouth daily.   montelukast 10 MG tablet Commonly known as: SINGULAIR Take 10 mg by mouth at bedtime.   nystatin powder Generic drug: nystatin Apply 1 g topically daily as needed (athlete's foot).   Pain Reliever 325 MG tablet Generic drug: acetaminophen TK 2 TS PO Q 6 H PRF PAIN   pantoprazole 40 MG tablet Commonly known as: PROTONIX   PriLOSEC OTC 20 MG tablet Generic drug: omeprazole Take by mouth.   tiZANidine 2 MG tablet Commonly known as: ZANAFLEX       Allergies: No Known Allergies  Family History: Family History  Problem Relation Age of Onset  . Stroke Mother   . Congestive Heart Failure Father   . Liver cancer Brother     Social History:  reports that he quit smoking about 33 years ago. He has never used smokeless tobacco. He reports current alcohol use. He reports that he does not use drugs.  ROS: UROLOGY Frequent Urination?: No Hard to postpone urination?: No Burning/pain with urination?: No Get up at night to urinate?:  No Leakage of urine?: No Urine stream starts and stops?: No Trouble starting stream?: No Do you have to strain to urinate?: No Blood in urine?: No Urinary tract infection?: No Sexually transmitted disease?: No Injury to kidneys or bladder?: No Painful intercourse?: No Weak stream?: No Erection problems?: No Penile pain?: No  Gastrointestinal Nausea?: No Vomiting?: No Indigestion/heartburn?: No Diarrhea?: No Constipation?: No  Constitutional Fever: No Night sweats?: No Weight loss?: No Fatigue?: No  Skin Skin rash/lesions?: No Itching?: No  Eyes Blurred vision?: No Double vision?: No  Ears/Nose/Throat Sore throat?: No Sinus problems?: No   Hematologic/Lymphatic Swollen glands?: No Easy bruising?: No  Cardiovascular Leg swelling?: No Chest pain?: No  Respiratory Cough?: No Shortness of breath?: No  Endocrine Excessive thirst?: No  Musculoskeletal Back pain?: No Joint pain?: No  Neurological Headaches?: No Dizziness?: No  Psychologic Depression?: No Anxiety?: No  Physical Exam: BP 131/73 (BP Location: Left Arm, Patient Position: Sitting, Cuff Size: Normal)   Pulse 76   Ht 5' 9.5" (1.765 m)   Wt 186 lb 12.8 oz (84.7 kg)   BMI 27.19 kg/m   Constitutional:  Alert and oriented, No acute distress. HEENT: [place] AT, moist mucus membranes.  Trachea midline, no masses. Cardiovascular: No clubbing, cyanosis, or edema. Respiratory: Normal respiratory effort, no increased work of breathing. GI: Abdomen is soft, nontender, nondistended, no abdominal masses GU: No CVA tenderness.  Prostate 50 g, smooth without nodules. Skin: No rashes, bruises or suspicious lesions. Neurologic: Grossly intact, no focal deficits, moving all 4 extremities. Psychiatric: Normal mood and affect.   Assessment & Plan:   73 year old male with mild PSA elevation and prior benign prostate biopsy in 2002.  DRE is benign.  PSA was ordered today and if stable he will continue annual follow-up.  Finasteride was refilled.  Riki AltesScott C Yoshiye Kraft, MD  [facility] [address], [address] [place], [place] [address] [phone]

## 2019-05-22 ENCOUNTER — Telehealth: Payer: Self-pay

## 2019-05-22 LAB — PSA: Prostate Specific Ag, Serum: 2.1 ng/mL (ref 0.0–4.0)

## 2019-05-22 NOTE — Telephone Encounter (Signed)
mychart notification sent 

## 2019-05-22 NOTE — Telephone Encounter (Signed)
-----   Message from Abbie Sons, MD sent at 05/22/2019  7:16 AM EDT ----- PSA stable at 2.1

## 2020-03-16 ENCOUNTER — Other Ambulatory Visit: Payer: Self-pay | Admitting: Physical Medicine & Rehabilitation

## 2020-03-16 DIAGNOSIS — M5416 Radiculopathy, lumbar region: Secondary | ICD-10-CM

## 2020-03-26 ENCOUNTER — Other Ambulatory Visit: Payer: Self-pay

## 2020-03-26 ENCOUNTER — Ambulatory Visit
Admission: RE | Admit: 2020-03-26 | Discharge: 2020-03-26 | Disposition: A | Discharge status: Home / Self Care | Payer: Medicare Other | Source: Ambulatory Visit | Attending: Physical Medicine & Rehabilitation | Admitting: Physical Medicine & Rehabilitation

## 2020-03-26 DIAGNOSIS — M5416 Radiculopathy, lumbar region: Secondary | ICD-10-CM | POA: Diagnosis present

## 2020-03-30 ENCOUNTER — Other Ambulatory Visit: Payer: Self-pay | Admitting: Urology

## 2020-05-13 ENCOUNTER — Other Ambulatory Visit: Payer: Medicare Other

## 2020-05-20 ENCOUNTER — Other Ambulatory Visit: Payer: Medicare Other

## 2020-05-21 ENCOUNTER — Ambulatory Visit: Payer: Medicare Other | Admitting: Urology

## 2020-05-27 ENCOUNTER — Other Ambulatory Visit: Payer: Medicare Other

## 2020-05-29 ENCOUNTER — Ambulatory Visit: Payer: Medicare Other | Admitting: Urology

## 2020-05-29 ENCOUNTER — Other Ambulatory Visit: Payer: Self-pay

## 2020-05-29 DIAGNOSIS — R972 Elevated prostate specific antigen [PSA]: Secondary | ICD-10-CM

## 2020-06-01 ENCOUNTER — Other Ambulatory Visit: Payer: Medicare Other

## 2020-06-01 ENCOUNTER — Other Ambulatory Visit
Admission: RE | Admit: 2020-06-01 | Discharge: 2020-06-01 | Disposition: A | Discharge status: Home / Self Care | Payer: Medicare Other | Attending: Urology | Admitting: Urology

## 2020-06-01 DIAGNOSIS — R972 Elevated prostate specific antigen [PSA]: Secondary | ICD-10-CM | POA: Diagnosis present

## 2020-06-01 LAB — PSA: Prostatic Specific Antigen: 1.61 ng/mL (ref 0.00–4.00)

## 2020-06-03 ENCOUNTER — Ambulatory Visit (INDEPENDENT_AMBULATORY_CARE_PROVIDER_SITE_OTHER): Payer: Medicare Other | Admitting: Urology

## 2020-06-03 ENCOUNTER — Encounter: Payer: Self-pay | Admitting: Urology

## 2020-06-03 ENCOUNTER — Other Ambulatory Visit: Payer: Self-pay

## 2020-06-03 VITALS — BP 119/78 | HR 84 | Ht 69.0 in | Wt 180.0 lb

## 2020-06-03 DIAGNOSIS — N401 Enlarged prostate with lower urinary tract symptoms: Secondary | ICD-10-CM

## 2020-06-03 DIAGNOSIS — Z87898 Personal history of other specified conditions: Secondary | ICD-10-CM

## 2020-06-03 NOTE — Progress Notes (Signed)
04/07/20 10:57 AM   Ethan Patton 1945/11/27 [id number]  Referring provider: Mickey Farber, MD [address] [facility] [place],  [place] [address] Chief Complaint  Patient presents with  . Elevated PSA    HPI: Ethan Patton is a 74 y.o. male who presents for an annual follow up.  -Has no complaints, no voiding problems -Denies flank, abdominal, pelvic pain -Currently remains on finasteride -PSA today is 1.61 (uncorrected)  PMH: Past Medical History:  Diagnosis Date  . Enlarged prostate   . Seasonal allergies     Surgical History: Past Surgical History:  Procedure Laterality Date  . APPENDECTOMY    . HERNIA REPAIR    . RIGHT/LEFT HEART CATH AND CORONARY ANGIOGRAPHY N/A 10/24/2018   Procedure: RIGHT/LEFT HEART CATH AND CORONARY ANGIOGRAPHY;  Surgeon: Dalia Heading, MD;  Location: [facility];  Service: Cardiovascular;  Laterality: N/A;  . ROTATOR CUFF REPAIR Left     Home Medications:  Allergies as of 06/03/2020   No Known Allergies     Medication List       Accurate as of June 03, 2020 10:57 AM. If you have any questions, ask your nurse or doctor.        aspirin EC 81 MG tablet Take 81 mg by mouth daily.   cholecalciferol 25 MCG (1000 UNIT) tablet Commonly known as: VITAMIN D3 Take 1,000 Units by mouth daily.   clopidogrel 75 MG tablet Commonly known as: PLAVIX TK 1 T PO ONCE D   finasteride 5 MG tablet Commonly known as: PROSCAR TAKE 1 TABLET DAILY   fluticasone 50 MCG/ACT nasal spray Commonly known as: FLONASE Place 2 sprays into both nostrils daily.   Iron 240 (27 Fe) MG Tabs Take 240 mg by mouth daily.   loratadine 10 MG tablet Commonly known as: CLARITIN Take 10 mg by mouth daily.   montelukast 10 MG tablet Commonly known as: SINGULAIR Take 10 mg by mouth at bedtime.   nystatin powder Generic drug: nystatin Apply 1 g topically daily as needed (athlete's foot).   Pain Reliever 325 MG tablet Generic  drug: acetaminophen TK 2 TS PO Q 6 H PRF PAIN   pantoprazole 40 MG tablet Commonly known as: PROTONIX   PriLOSEC OTC 20 MG tablet Generic drug: omeprazole Take by mouth.   tiZANidine 2 MG tablet Commonly known as: ZANAFLEX       Allergies: No Known Allergies  Family History: Family History  Problem Relation Age of Onset  . Stroke Mother   . Congestive Heart Failure Father   . Liver cancer Brother     Social History:  reports that he quit smoking about 34 years ago. He has never used smokeless tobacco. He reports current alcohol use. He reports that he does not use drugs.   Physical Exam: BP 119/78   Pulse 84   Ht 5\' 9"  (1.753 m)   Wt 180 lb (81.6 kg)   BMI 26.58 kg/m   Constitutional:  Alert and oriented, No acute distress. HEENT: [place] AT, moist mucus membranes.  Trachea midline, no masses. Cardiovascular: No clubbing, cyanosis, or edema. Respiratory: Normal respiratory effort, no increased work of breathing. Skin: No rashes, bruises or suspicious lesions. Neurologic: Grossly intact, no focal deficits, moving all 4 extremities. Psychiatric: Normal mood and affect.  Assessment & Plan:    1. BPH with LUTS -Stable voiding symptoms on finasteride   2. History of elevated PSA -PSA is stable -Return to clinic in 12  months for PSA and DRE  [facility] [address] [place], [place] [address] [phone]  I, Francina Ames Peace, am acting as a Neurosurgeon for Dr. Lorin Picket C. Cortny Bambach.  I have reviewed the above documentation for accuracy and completeness, and I agree with the above.   Riki Altes, MD

## 2020-06-29 ENCOUNTER — Ambulatory Visit: Payer: Medicare Other | Admitting: Internal Medicine

## 2020-06-29 NOTE — Progress Notes (Signed)
No show

## 2020-07-02 ENCOUNTER — Ambulatory Visit (INDEPENDENT_AMBULATORY_CARE_PROVIDER_SITE_OTHER): Payer: Medicare Other | Admitting: Internal Medicine

## 2020-07-02 VITALS — Ht 69.0 in | Wt 190.0 lb

## 2020-07-02 DIAGNOSIS — J301 Allergic rhinitis due to pollen: Secondary | ICD-10-CM

## 2020-07-02 DIAGNOSIS — Z9989 Dependence on other enabling machines and devices: Secondary | ICD-10-CM

## 2020-07-02 DIAGNOSIS — G4733 Obstructive sleep apnea (adult) (pediatric): Secondary | ICD-10-CM | POA: Diagnosis not present

## 2020-07-02 DIAGNOSIS — K219 Gastro-esophageal reflux disease without esophagitis: Secondary | ICD-10-CM

## 2020-07-02 NOTE — Progress Notes (Signed)
[facility] [address] [place], [place] [address]  Pulmonary Sleep Medicine   Office Visit Note  Patient Name: Ethan Patton DOB: 1945-11-27 MRN [id number]  Date of Service: 07/02/2020 I connected with  [name] on 07/02/20 by a video enabled telemedicine application and verified that I am speaking with the correct person using two identifiers.   I discussed the limitations of evaluation and management by telemedicine. The patient expressed understanding and agreed to proceed.  Chief Complaint: Obstructive Sleep Apnea visit  Brief History:  Ethan Patton is seen today for OSA for follow up. The patient has a 9 year history of sleep apnea. Patient is using PAP nightly.  The patient feels rested after sleeping with PAP.  The patient reports good benefit  from PAP use. Reported sleepiness is  Improved and the Epworth Sleepiness Score is 0 out of 24. The patient does not take naps. The patient complains of the following: no complaint  The compliance download shows  compliance with an average use time of 7.4 hours. The AHI is 1.3  The patient does not complain of limb movements disrupting sleep.  ROS  General: (-) fever, (-) chills, (-) night sweat Nose and Sinuses: (-) nasal stuffiness or itchiness, (-) postnasal drip, (-) nosebleeds, (-) sinus trouble. Mouth and Throat: (-) sore throat, (-) hoarseness. Neck: (-) swollen glands, (-) enlarged thyroid, (-) neck pain. Respiratory: - cough, - shortness of breath, - wheezing. Neurologic: - numbness, - tingling. Psychiatric: - anxiety, - depression   Current Medication: Outpatient Encounter Medications as of 07/02/2020  Medication Sig  . aspirin EC 81 MG tablet Take 81 mg by mouth daily.  . cholecalciferol (VITAMIN D3) 25 MCG (1000 UT) tablet Take 1,000 Units by mouth daily.  . clopidogrel (PLAVIX) 75 MG tablet TK 1 T PO ONCE D  . Ferrous Gluconate (IRON) 240 (27 Fe) MG TABS Take 240 mg by mouth daily.  . finasteride  (PROSCAR) 5 MG tablet TAKE 1 TABLET DAILY  . fluticasone (FLONASE) 50 MCG/ACT nasal spray Place 2 sprays into both nostrils daily.   Marland Kitchen loratadine (CLARITIN) 10 MG tablet Take 10 mg by mouth daily.  . montelukast (SINGULAIR) 10 MG tablet Take 10 mg by mouth at bedtime.  Marland Kitchen nystatin (NYSTATIN) powder Apply 1 g topically daily as needed (athlete's foot).  Marland Kitchen omeprazole (PRILOSEC OTC) 20 MG tablet Take by mouth.  Marland Kitchen PAIN RELIEVER 325 MG tablet TK 2 TS PO Q 6 H PRF PAIN  . pantoprazole (PROTONIX) 40 MG tablet   . tiZANidine (ZANAFLEX) 2 MG tablet    No facility-administered encounter medications on file as of 07/02/2020.    Surgical History: Past Surgical History:  Procedure Laterality Date  . APPENDECTOMY    . HERNIA REPAIR    . RIGHT/LEFT HEART CATH AND CORONARY ANGIOGRAPHY N/A 10/24/2018   Procedure: RIGHT/LEFT HEART CATH AND CORONARY ANGIOGRAPHY;  Surgeon: Dalia Heading, MD;  Location: [facility];  Service: Cardiovascular;  Laterality: N/A;  . ROTATOR CUFF REPAIR Left     Medical History: Past Medical History:  Diagnosis Date  . Enlarged prostate   . Seasonal allergies     Family History: Non contributory to the present illness  Social History: Social History   Socioeconomic History  . Marital status: Married    Spouse name: Not on file  . Number of children: Not on file  . Years of education: Not on file  . Highest education level: Not on file  Occupational  History  . Not on file  Tobacco Use  . Smoking status: Former Smoker    Quit date: 1987    Years since quitting: 34.6  . Smokeless tobacco: Never Used  Vaping Use  . Vaping Use: Never used  Substance and Sexual Activity  . Alcohol use: Yes    Comment: occasional wine  . Drug use: No  . Sexual activity: Yes    Birth control/protection: None  Other Topics Concern  . Not on file  Social History Narrative  . Not on file   Social Determinants of Health   Financial Resource Strain:   . Difficulty  of Paying Living Expenses: Not on file  Food Insecurity:   . Worried About Programme researcher, broadcasting/film/video in the Last Year: Not on file  . Ran Out of Food in the Last Year: Not on file  Transportation Needs:   . Lack of Transportation (Medical): Not on file  . Lack of Transportation (Non-Medical): Not on file  Physical Activity:   . Days of Exercise per Week: Not on file  . Minutes of Exercise per Session: Not on file  Stress:   . Feeling of Stress : Not on file  Social Connections:   . Frequency of Communication with Friends and Family: Not on file  . Frequency of Social Gatherings with Friends and Family: Not on file  . Attends Religious Services: Not on file  . Active Member of Clubs or Organizations: Not on file  . Attends Banker Meetings: Not on file  . Marital Status: Not on file  Intimate Partner Violence:   . Fear of Current or Ex-Partner: Not on file  . Emotionally Abused: Not on file  . Physically Abused: Not on file  . Sexually Abused: Not on file    Vital Signs: There were no vitals taken for this visit.  Examination: General Appearance: The patient is well-developed, well-nourished, and in no distress. Neck Circumference: 38cm Skin: Gross inspection of skin unremarkable. Head: normocephalic, no gross deformities. Eyes: no gross deformities noted. ENT: ears appear grossly normal Neurologic: Alert and oriented. No involuntary movements.    EPWORTH SLEEPINESS SCALE:  Scale:  (0)= no chance of dozing; (1)= slight chance of dozing; (2)= moderate chance of dozing; (3)= high chance of dozing  Chance  Situtation    Sitting and reading: 0    Watching TV: 0    Sitting Inactive in public: 0    As a passenger in car: 0      Lying down to rest: 0    Sitting and talking: 0    Sitting quielty after lunch: 0    In a car, stopped in traffic: 0   TOTAL SCORE:   0 out of 24    SLEEP STUDIES:  1. 11/25/2010 PSG: ahi 8, min sat 82%   CPAP COMPLIANCE  DATA:  Date Range: 06/27/2019-06/25/2020  Average Daily Use: 7.4 hours  Median Use: 7.5  Compliance for > 4 Hours: 72% days  AHI: 1.3 respiratory events per hour  Days Used: 267/365  Mask Leak: 14  95th Percentile Pressure: 8  *Machine did not record for 4 months*   LABS: Recent Results (from the past 2160 hour(s))  PSA     Status: None   Collection Time: 06/01/20  1:32 PM  Result Value Ref Range   Prostatic Specific Antigen 1.61 0.00 - 4.00 ng/mL    Comment: (NOTE) While PSA levels of <=4.0 ng/ml are reported as reference range, some  men with levels below 4.0 ng/ml can have prostate cancer and many men with PSA above 4.0 ng/ml do not have prostate cancer.  Other tests such as free PSA, age specific reference ranges, PSA velocity and PSA doubling time may be helpful especially in men less than 47 years old. Performed at [facility], [address] [place], [place] [address]     Radiology: No results found.  No results found.  No results found.    Assessment and Plan: Patient Active Problem List   Diagnosis Date Noted  . Elevated PSA 05/21/2019  . Benign prostatic hyperplasia with weak urinary stream 05/21/2019  . Erectile dysfunction due to arterial insufficiency 05/21/2019     1. OSA on CPAP The patient does tolerate PAP and reports good benefit from PAP use. The patient was reminded to change mask seal, headgear, and filters.  Also to clean machine and also to empty water chamber and allow to dry daily.  The patient was also counselled on good sleep hygiene.  The compliance is good. The AHI is 1.3.  2.  Allergic rhinitis patient has been on fluticasone as well as Singulair we will continue.  3.  GERD at baseline supportive care patient is on Protonix  General Counseling: I have discussed the findings of the evaluation and examination with [facility].  I have also discussed any further diagnostic evaluation thatmay be needed or ordered today.  Omari verbalizes understanding of the findings of todays visit. We also reviewed his medications today and discussed drug interactions and side effects including but not limited excessive drowsiness and altered mental states. We also discussed that there is always a risk not just to him but also people around him. he has been encouraged to call the office with any questions or concerns that should arise related to todays visit.  No orders of the defined types were placed in this encounter.    I have personally obtained a history, examined the patient, evaluated laboratory and imaging results, formulated the assessment and plan and placed orders.  This patient was seen by Blima Ledger AGNP-C in Collaboration with Dr. Freda Munro as a part of collaborative care agreement.  Valentino Hue Sol Blazing, PhD, FAASM  Diplomate, American Board of Sleep Medicine    Yevonne Pax, MD [facility] Diplomate ABMS Pulmonary and Critical Care Medicine Sleep medicine

## 2020-10-09 ENCOUNTER — Encounter: Payer: Self-pay | Admitting: Urology

## 2020-10-09 ENCOUNTER — Other Ambulatory Visit: Payer: Self-pay | Admitting: *Deleted

## 2020-10-09 MED ORDER — FINASTERIDE 5 MG PO TABS: 5.0000 mg | ORAL_TABLET | Freq: Every day | ORAL | 0 refills | Status: AC

## 2020-11-03 ENCOUNTER — Encounter: Payer: Self-pay | Admitting: Urology

## 2021-03-29 ENCOUNTER — Encounter: Payer: Self-pay | Admitting: Urology

## 2021-03-30 ENCOUNTER — Other Ambulatory Visit: Payer: Self-pay | Admitting: Urology

## 2021-04-26 IMAGING — MR MR LUMBAR SPINE W/O CM
5 series · 30 of 48 positions shown · non-contrast
Comparison: None.

CLINICAL DATA: Low back pain with weakness of the right leg.
Bilateral leg pain and numbness.

EXAM:
MRI LUMBAR SPINE WITHOUT CONTRAST
TECHNIQUE: Multiplanar, multisequence MR imaging of the lumbar spine was
performed. No intravenous contrast was administered.

[Series 5: T2 · sagittal · 4.0mm · 0.81mm/px · 6 of 17 slices shown (1 of 2)]
[im 1/17]
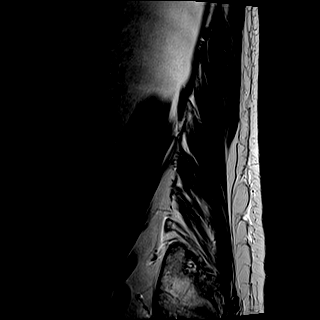
[im 4/17]
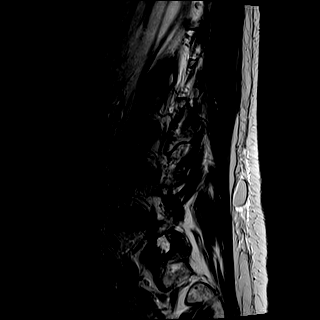
[im 7/17]
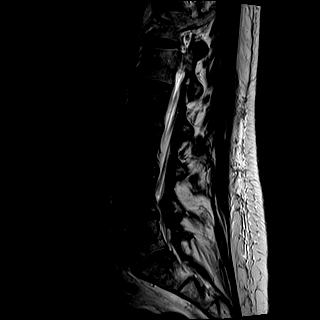
[im 10/17]
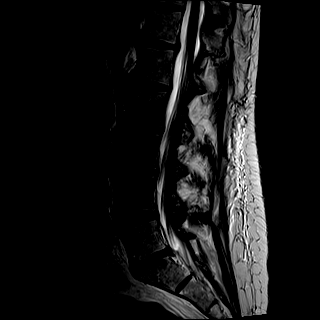
[im 13/17]
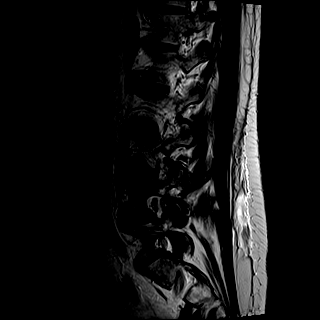
[im 17/17]
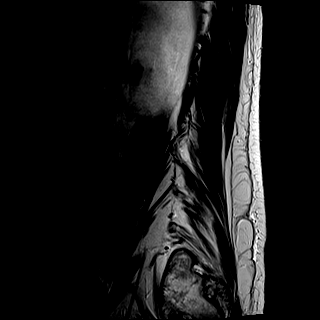

[Series 6: T1 · sagittal · 4.0mm · 0.81mm/px · 7 of 17 slices shown (1 of 2)]
[im 1/17]
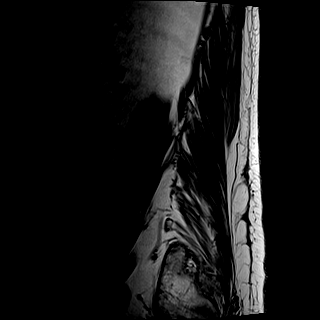
[im 3/17]
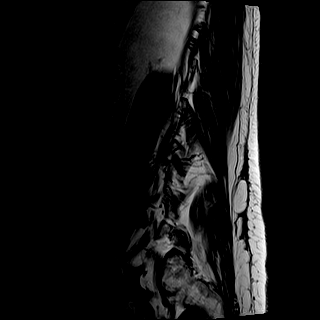
[im 6/17]
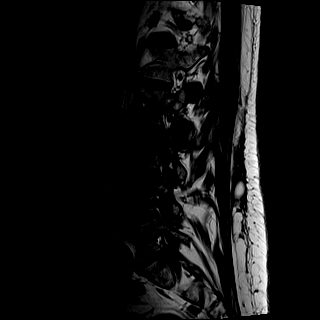
[im 9/17]
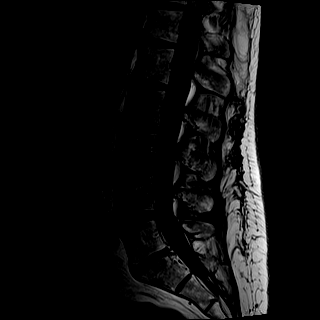
[im 11/17]
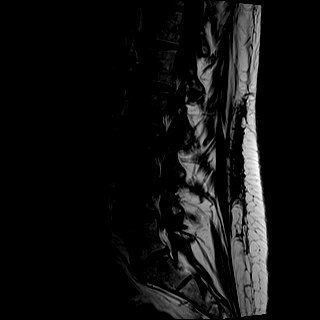
[im 14/17]
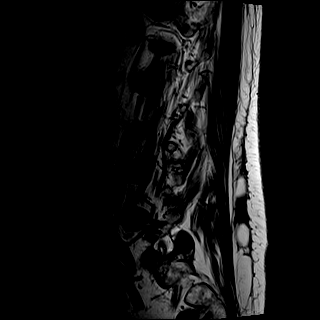
[im 17/17]
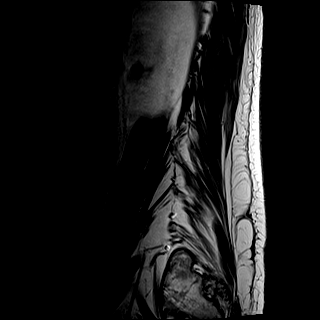

[Series 7: STIR · sagittal · 4.0mm · 0.41mm/px · 1 of 17 slices shown]
[im 1/17]
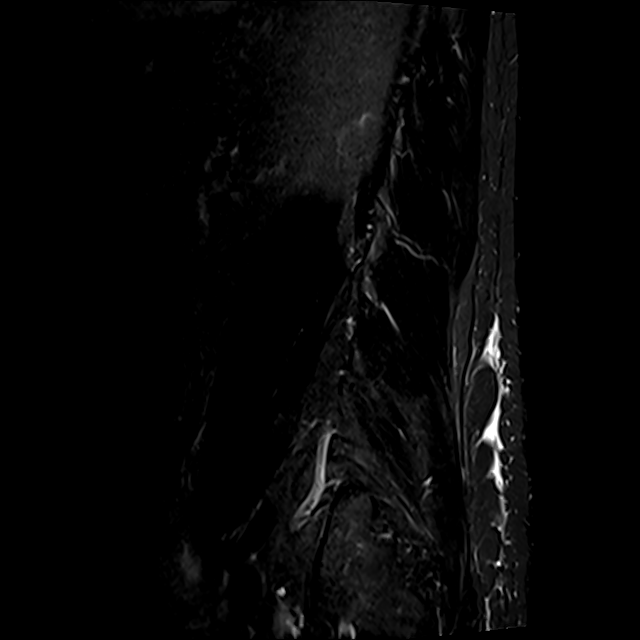

[Series 8: T2 · axial · 4.0mm · 0.78mm/px · z∈[-86,+122]mm · 8 of 37 slices shown (2 of 2)]
[im 1/37]
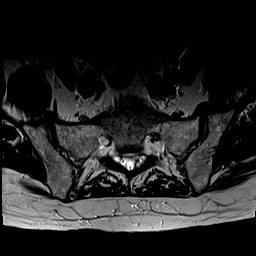
[im 6/37]
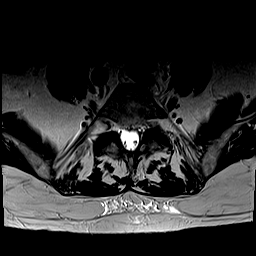
[im 12/37]
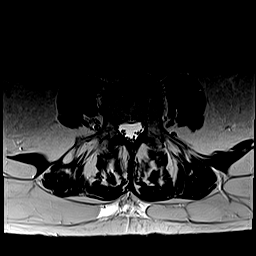
[im 17/37]
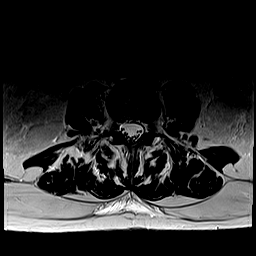
[im 20/37]
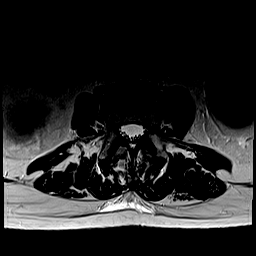
[im 25/37]
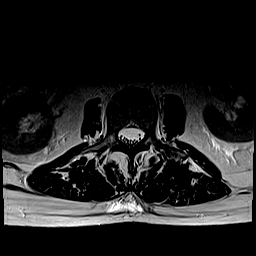
[im 31/37]
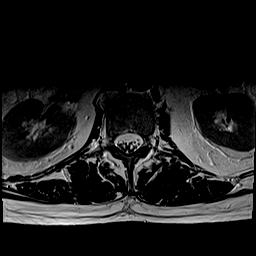
[im 37/37]
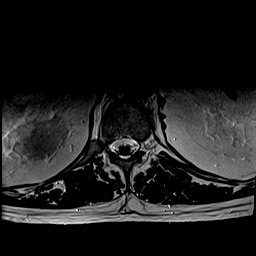

[Series 9: T1 · axial · 4.0mm · 0.39mm/px · z∈[-86,+122]mm · 8 of 37 slices shown (2 of 2)]
[im 1/37]
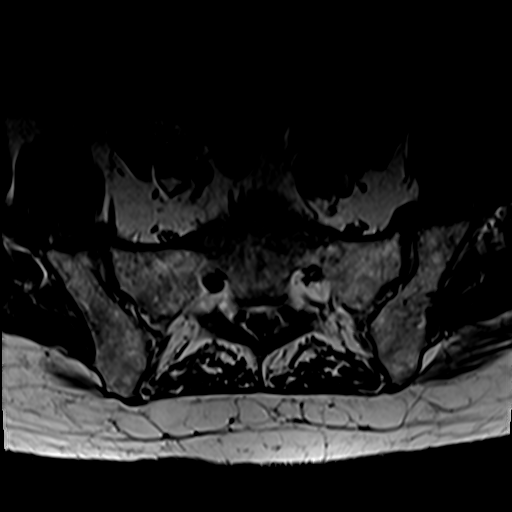
[im 6/37]
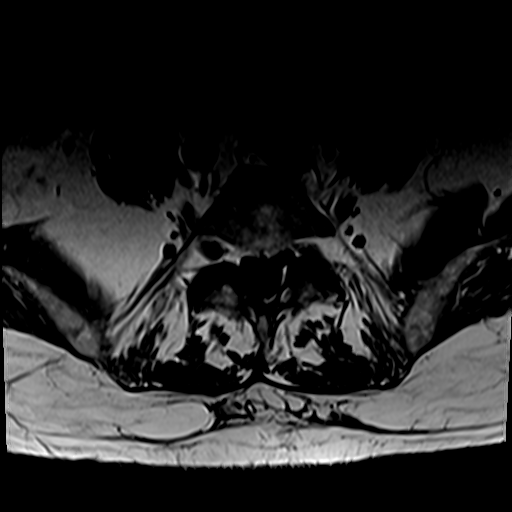
[im 12/37]
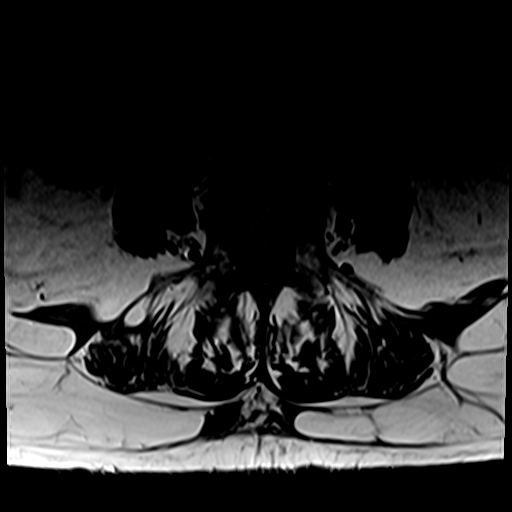
[im 17/37]
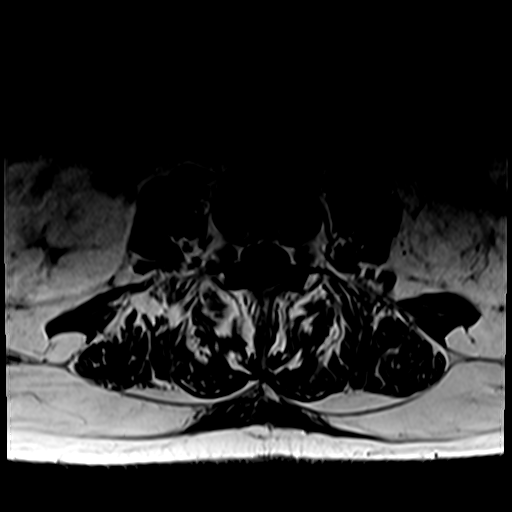
[im 20/37]
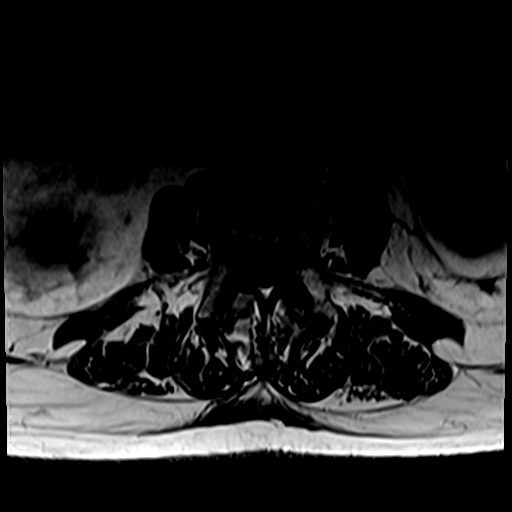
[im 25/37]
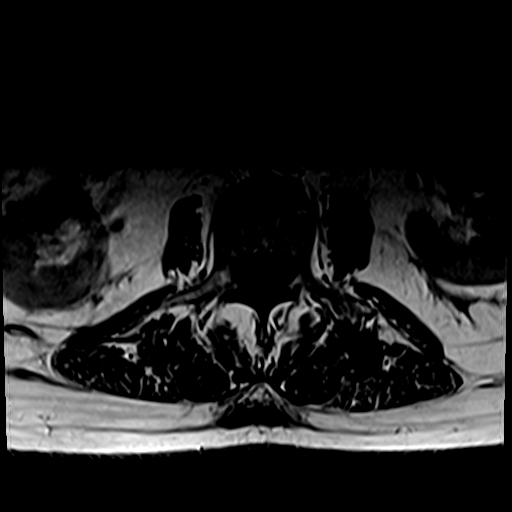
[im 31/37]
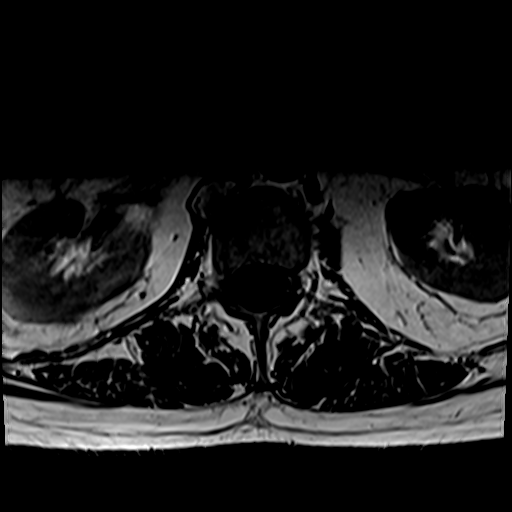
[im 37/37]
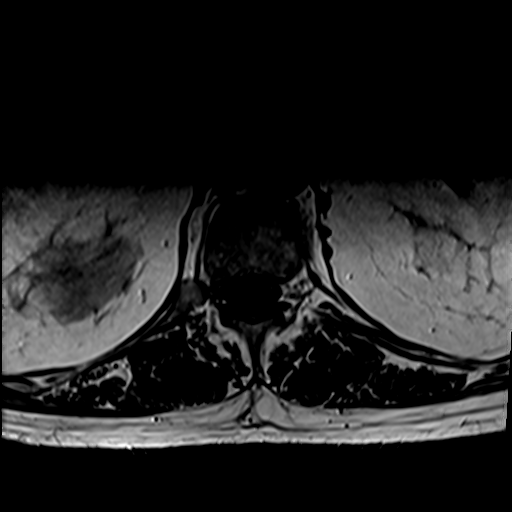

[30 of 48 positions shown; findings below may reference images not displayed]

FINDINGS: Segmentation:  5 lumbar type vertebral bodies.

Alignment:  Normal

Vertebrae:  Normal

Conus medullaris and cauda equina: Conus extends to the L1 level.
Conus and cauda equina appear normal.

Paraspinal and other soft tissues: Normal

Disc levels:

T12-L1: Minimal disc bulge.  No stenosis.

L1-2, L2-3 and L3-4: Normal interspaces.

L4-5: Normal appearance of the disc. Bilateral facet osteoarthritis
with mild edema. No neural compressive stenosis. The facet arthritis
could be a cause of back pain or referred facet syndrome pain.

L5-S1: Disc degeneration with loss of disc height. Shallow disc
protrusion with slight caudal down turning. This does not seem to
cause any neural compression. At this level, the left L5 and S1 root
sleeves are conjoined. There is a small cyst in the lateral foramen
on the left adjacent to the exiting L5 nerve. I am not sure this is
a synovial cyst from the facet joint or a small root sleeve cyst.
The facet joint shows only minimal osteoarthritis in therefore this
may be a root sleeve cyst.
IMPRESSION: At L4-5, there is bilateral facet arthropathy with edematous change
which could be a cause of back pain or referred facet syndrome pain.
No neural compressive stenosis at or above that level.

At L5-S1, the patient has the congenital variation of conjoined root
sleeves on the left at L5 and S1. In certain instances, conjoined
nerve roots can be symptomatic. The L5-S1 disc shows a shallow
protrusion with slight caudal down turning that does not visibly
cause neural compression. There is a small cyst in the lateral
foramen on the left that I favor represents a root sleeve cyst
rather than a synovial cyst from the left facet joint, as the facet
joint does not appear to show significant osteoarthritis.

## 2021-06-01 ENCOUNTER — Other Ambulatory Visit: Payer: Self-pay

## 2021-06-03 ENCOUNTER — Ambulatory Visit: Payer: Self-pay | Admitting: Urology

## 2021-07-08 NOTE — Progress Notes (Signed)
[facility] [address] [place], [place] [address]  Pulmonary Sleep Medicine   Office Visit Note  Patient Name: Ethan Patton DOB: 1946/11/04 MRN [id number]    Chief Complaint: Obstructive Sleep Apnea visit  Brief History:  Mithran is seen today for a 1 year follow up on CPAP. The patient is currently on CPAP 8cmH2O.  The patient has a 10 year history of sleep apnea. Patient is using PAP nightly.  The patient feels good after sleeping with PAP.  The patient reports no problems from PAP use. Reported sleepiness is  improved and the Epworth Sleepiness Score is 5 out of 24. The patient does not  take naps. The patient complains of the following: dry mouth  The compliance download shows 84% compliance with an average use time of 7 hours 35 mins. The AHI is 1.7  The patient does not complain of limb movements disrupting sleep.  ROS  General: (-) fever, (-) chills, (-) night sweat Nose and Sinuses: (-) nasal stuffiness or itchiness, (-) postnasal drip, (-) nosebleeds, (-) sinus trouble. Mouth and Throat: (-) sore throat, (-) hoarseness. Neck: (-) swollen glands, (-) enlarged thyroid, (-) neck pain. Respiratory: - cough, - shortness of breath, - wheezing. Neurologic: - numbness, - tingling. Psychiatric: - anxiety, - depression   Current Medication: Outpatient Encounter Medications as of 07/09/2021  Medication Sig   aspirin EC 81 MG tablet Take 81 mg by mouth daily.   cholecalciferol (VITAMIN D3) 25 MCG (1000 UT) tablet Take 1,000 Units by mouth daily.   clopidogrel (PLAVIX) 75 MG tablet TK 1 T PO ONCE D   Ferrous Gluconate (IRON) 240 (27 Fe) MG TABS Take 240 mg by mouth daily.   finasteride (PROSCAR) 5 MG tablet Take 1 tablet (5 mg total) by mouth daily.   fluticasone (FLONASE) 50 MCG/ACT nasal spray Place 2 sprays into both nostrils daily.    loratadine (CLARITIN) 10 MG tablet Take 10 mg by mouth daily.   montelukast (SINGULAIR) 10 MG tablet Take 10 mg by mouth at  bedtime.   nystatin (NYSTATIN) powder Apply 1 g topically daily as needed (athlete's foot).   omeprazole (PRILOSEC OTC) 20 MG tablet Take by mouth.   PAIN RELIEVER 325 MG tablet TK 2 TS PO Q 6 H PRF PAIN   pantoprazole (PROTONIX) 40 MG tablet    tiZANidine (ZANAFLEX) 2 MG tablet    No facility-administered encounter medications on file as of 07/09/2021.    Surgical History: Past Surgical History:  Procedure Laterality Date   APPENDECTOMY     HERNIA REPAIR     RIGHT/LEFT HEART CATH AND CORONARY ANGIOGRAPHY N/A 10/24/2018   Procedure: RIGHT/LEFT HEART CATH AND CORONARY ANGIOGRAPHY;  Surgeon: Dalia Heading, MD;  Location: [facility] INVASIVE CV LAB;  Service: Cardiovascular;  Laterality: N/A;   ROTATOR CUFF REPAIR Left     Medical History: Past Medical History:  Diagnosis Date   Enlarged prostate    OSA on CPAP    Seasonal allergies     Family History: Non contributory to the present illness  Social History: Social History   Socioeconomic History   Marital status: Married    Spouse name: Not on file   Number of children: Not on file   Years of education: Not on file   Highest education level: Not on file  Occupational History   Not on file  Tobacco Use   Smoking status: Former    Types: Cigarettes    Quit date: 64  Years since quitting: 35.6   Smokeless tobacco: Never  Vaping Use   Vaping Use: Never used  Substance and Sexual Activity   Alcohol use: Yes    Comment: occasional wine   Drug use: No   Sexual activity: Yes    Birth control/protection: None  Other Topics Concern   Not on file  Social History Narrative   Not on file   Social Determinants of Health   Financial Resource Strain: Not on file  Food Insecurity: Not on file  Transportation Needs: Not on file  Physical Activity: Not on file  Stress: Not on file  Social Connections: Not on file  Intimate Partner Violence: Not on file    Vital Signs: There were no vitals taken for this  visit.  Examination: General Appearance: The patient is well-developed, well-nourished, and in no distress. Neck Circumference:  Skin: Gross inspection of skin unremarkable. Head: normocephalic, no gross deformities. Eyes: no gross deformities noted. ENT: ears appear grossly normal Neurologic: Alert and oriented. No involuntary movements.    EPWORTH SLEEPINESS SCALE:  Scale:  (0)= no chance of dozing; (1)= slight chance of dozing; (2)= moderate chance of dozing; (3)= high chance of dozing  Chance  Situtation    Sitting and reading: 2    Watching TV: 1    Sitting Inactive in public: 0    As a passenger in car: 0      Lying down to rest: 2    Sitting and talking: 0    Sitting quielty after lunch: 0    In a car, stopped in traffic: 0   TOTAL SCORE:   5 out of 24    SLEEP STUDIES:  PSG 11/25/10 AHI 8, Lowest SpO2 82% Titration 12/02/10 CPAP 7cmH2O   CPAP COMPLIANCE DATA:  Date Range: 07/07/20-07/06/21  Average Daily Use: 7 hours 35 mins  Median Use: 7 hrs 48 mins  Compliance for > 4 Hours: 84% days  AHI: 1.7 respiratory events per hour  Days Used: 317/365  Mask Leak: 25.5  95th Percentile Pressure: 8         LABS: No results found for this or any previous visit (from the past 2160 hour(s)).  Radiology: No results found.  No results found.  No results found.    Assessment and Plan: Patient Active Problem List   Diagnosis Date Noted   OSA on CPAP    Elevated PSA 05/21/2019   Benign prostatic hyperplasia with weak urinary stream 05/21/2019   Erectile dysfunction due to arterial insufficiency 05/21/2019    1. OSA on CPAP The patient does  tolerate PAP and reports definite benefit from PAP use. The patient was reminded how to clean equipment and advised to replace supplies routinely. The patient was also counselled on weight loss, and adjusting his humidity settings to try to help his dry mouth and congestion.. The compliance is  very good. The AHI is 1.7.   OSA- continue very good compliance. F/u one year.   2. CPAP use counseling CPAP Counseling: had a lengthy discussion with the patient regarding the importance of PAP therapy in management of the sleep apnea. Patient appears to understand the risk factor reduction and also understands the risks associated with untreated sleep apnea. Patient will try to make a good faith effort to remain compliant with therapy. Also instructed the patient on proper cleaning of the device including the water must be changed daily if possible and use of distilled water is preferred. Patient understands that the machine  should be regularly cleaned with appropriate recommended cleaning solutions that do not damage the PAP machine for example given white vinegar and water rinses. Other methods such as ozone treatment may not be as good as these simple methods to achieve cleaning.   3. Gastroesophageal reflux disease without esophagitis Controlled. Avoid dietary triggers.    General Counseling: I have discussed the findings of the evaluation and examination with Jacky.  I have also discussed any further diagnostic evaluation thatmay be needed or ordered today. Elyas verbalizes understanding of the findings of todays visit. We also reviewed his medications today and discussed drug interactions and side effects including but not limited excessive drowsiness and altered mental states. We also discussed that there is always a risk not just to him but also people around him. he has been encouraged to call the office with any questions or concerns that should arise related to todays visit.  No orders of the defined types were placed in this encounter.       I have personally obtained a history, examined the patient, evaluated laboratory and imaging results, formulated the assessment and plan and placed orders.   This patient was seen today by Emmaline Kluver, PA-C in collaboration with Dr.  Freda Munro.    Yevonne Pax, MD [facility] Diplomate ABMS Pulmonary and Critical Care Medicine Sleep medicine

## 2021-07-09 ENCOUNTER — Ambulatory Visit (INDEPENDENT_AMBULATORY_CARE_PROVIDER_SITE_OTHER): Payer: Medicare Other | Admitting: Internal Medicine

## 2021-07-09 VITALS — Ht 69.0 in | Wt 189.0 lb

## 2021-07-09 DIAGNOSIS — Z7189 Other specified counseling: Secondary | ICD-10-CM | POA: Diagnosis not present

## 2021-07-09 DIAGNOSIS — G4733 Obstructive sleep apnea (adult) (pediatric): Secondary | ICD-10-CM

## 2021-07-09 DIAGNOSIS — Z9989 Dependence on other enabling machines and devices: Secondary | ICD-10-CM | POA: Diagnosis not present

## 2021-07-09 DIAGNOSIS — K219 Gastro-esophageal reflux disease without esophagitis: Secondary | ICD-10-CM

## 2021-07-09 NOTE — Patient Instructions (Signed)
